# Patient Record
Sex: Male | Born: 1965 | Race: White | Hispanic: No | Marital: Married | Smoking: Never smoker
Health system: Southern US, Community
[De-identification: ages and names within clinical notes are randomized; demographics above are authoritative.]

## PROBLEM LIST (undated history)

## (undated) HISTORY — PX: APPENDECTOMY: SHX54

---

## 2013-10-13 ENCOUNTER — Ambulatory Visit: Payer: PRIVATE HEALTH INSURANCE | Admitting: Sports Medicine

## 2015-01-22 ENCOUNTER — Encounter (HOSPITAL_COMMUNITY): Payer: Self-pay | Admitting: *Deleted

## 2015-01-22 ENCOUNTER — Emergency Department (HOSPITAL_COMMUNITY)
Admission: EM | Admit: 2015-01-22 | Discharge: 2015-01-22 | Disposition: A | Discharge status: Home / Self Care | Payer: PRIVATE HEALTH INSURANCE | Attending: Emergency Medicine | Admitting: Emergency Medicine

## 2015-01-22 DIAGNOSIS — W51XXXA Accidental striking against or bumped into by another person, initial encounter: Secondary | ICD-10-CM | POA: Insufficient documentation

## 2015-01-22 DIAGNOSIS — Z23 Encounter for immunization: Secondary | ICD-10-CM | POA: Diagnosis not present

## 2015-01-22 DIAGNOSIS — Y9366 Activity, soccer: Secondary | ICD-10-CM | POA: Diagnosis not present

## 2015-01-22 DIAGNOSIS — S0990XA Unspecified injury of head, initial encounter: Secondary | ICD-10-CM

## 2015-01-22 DIAGNOSIS — Y92322 Soccer field as the place of occurrence of the external cause: Secondary | ICD-10-CM | POA: Diagnosis not present

## 2015-01-22 DIAGNOSIS — Y998 Other external cause status: Secondary | ICD-10-CM | POA: Diagnosis not present

## 2015-01-22 DIAGNOSIS — S0181XA Laceration without foreign body of other part of head, initial encounter: Secondary | ICD-10-CM | POA: Diagnosis not present

## 2015-01-22 MED ORDER — LIDOCAINE-EPINEPHRINE (PF) 2 %-1:200000 IJ SOLN
10.0000 mL | Freq: Once | INTRAMUSCULAR | Status: DC
  Filled 2015-01-22: qty 20

## 2015-01-22 NOTE — Discharge Instructions (Signed)
Triple antibiotic topically twice a day. Ice pack for swelling several times a day. Keep it clean. Follow up in 5-7 days for suture removal. Follow up with Dr. Kelly Splinter if any concern about scarring. Watch for signs of major head injury such as worsening headache, vomiting, dizziness, memory issues, confusion.   Facial Laceration  A facial laceration is a cut on the face. These injuries can be painful and cause bleeding. Lacerations usually heal quickly, but they need special care to reduce scarring. DIAGNOSIS  Your health care provider will take a medical history, ask for details about how the injury occurred, and examine the wound to determine how deep the cut is. TREATMENT  Some facial lacerations may not require closure. Others may not be able to be closed because of an increased risk of infection. The risk of infection and the chance for successful closure will depend on various factors, including the amount of time since the injury occurred. The wound may be cleaned to help prevent infection. If closure is appropriate, pain medicines may be given if needed. Your health care provider will use stitches (sutures), wound glue (adhesive), or skin adhesive strips to repair the laceration. These tools bring the skin edges together to allow for faster healing and a better cosmetic outcome. If needed, you may also be given a tetanus shot. HOME CARE INSTRUCTIONS  Only take over-the-counter or prescription medicines as directed by your health care provider.  Follow your health care provider's instructions for wound care. These instructions will vary depending on the technique used for closing the wound. For Sutures:  Keep the wound clean and dry.   If you were given a bandage (dressing), you should change it at least once a day. Also change the dressing if it becomes wet or dirty, or as directed by your health care provider.   Wash the wound with soap and water 2 times a day. Rinse the wound off with  water to remove all soap. Pat the wound dry with a clean towel.   After cleaning, apply a thin layer of the antibiotic ointment recommended by your health care provider. This will help prevent infection and keep the dressing from sticking.   You may shower as usual after the first 24 hours. Do not soak the wound in water until the sutures are removed.   Get your sutures removed as directed by your health care provider. With facial lacerations, sutures should usually be taken out after 4-5 days to avoid stitch marks.   Wait a few days after your sutures are removed before applying any makeup. For Skin Adhesive Strips:  Keep the wound clean and dry.   Do not get the skin adhesive strips wet. You may bathe carefully, using caution to keep the wound dry.   If the wound gets wet, pat it dry with a clean towel.   Skin adhesive strips will fall off on their own. You may trim the strips as the wound heals. Do not remove skin adhesive strips that are still stuck to the wound. They will fall off in time.  For Wound Adhesive:  You may briefly wet your wound in the shower or bath. Do not soak or scrub the wound. Do not swim. Avoid periods of heavy sweating until the skin adhesive has fallen off on its own. After showering or bathing, gently pat the wound dry with a clean towel.   Do not apply liquid medicine, cream medicine, ointment medicine, or makeup to your wound while the  skin adhesive is in place. This may loosen the film before your wound is healed.   If a dressing is placed over the wound, be careful not to apply tape directly over the skin adhesive. This may cause the adhesive to be pulled off before the wound is healed.   Avoid prolonged exposure to sunlight or tanning lamps while the skin adhesive is in place.  The skin adhesive will usually remain in place for 5-10 days, then naturally fall off the skin. Do not pick at the adhesive film.  After Healing: Once the wound has  healed, cover the wound with sunscreen during the day for 1 full year. This can help minimize scarring. Exposure to ultraviolet light in the first year will darken the scar. It can take 1-2 years for the scar to lose its redness and to heal completely.  SEEK IMMEDIATE MEDICAL CARE IF:  You have redness, pain, or swelling around the wound.   You see ayellowish-white fluid (pus) coming from the wound.   You have chills or a fever.  MAKE SURE YOU:  Understand these instructions.  Will watch your condition.  Will get help right away if you are not doing well or get worse. Document Released: 09/05/2004 Document Revised: 05/19/2013 Document Reviewed: 03/11/2013 [facility] Patient Information 2015 [place], [place]. This information is not intended to replace advice given to you by your health care provider. Make sure you discuss any questions you have with your health care provider.   Scar Minimization You will have a scar anytime you have surgery and a cut is made in the skin or you have something removed from your skin (mole, skin cancer, cyst). Although scars are unavoidable following surgery, there are ways to minimize their appearance. It is important to follow all the instructions you receive from your caregiver about wound care. How your wound heals will influence the appearance of your scar. If you do not follow the wound care instructions as directed, complications such as infection may occur. Wound instructions include keeping the wound clean, moist, and not letting the wound form a scab. Some people form scars that are raised and lumpy (hypertrophic) or larger than the initial wound (keloidal). HOME CARE INSTRUCTIONS   Follow wound care instructions as directed.  Keep the wound clean by washing it with soap and water.  Keep the wound moist with provided antibiotic cream or petroleum jelly until completely healed. Moisten twice a day for about 2 weeks.  Get stitches (sutures) taken  out at the scheduled time.  Avoid touching or manipulating your wound unless needed. Wash your hands thoroughly before and after touching your wound.  Follow all restrictions such as limits on exercise or work. This depends on where your scar is located.  Keep the scar protected from sunburn. Cover the scar with sunscreen/sunblock with SPF 30 or higher.  Gently massage the scar using a circular motion to help minimize the appearance of the scar. Do this only after the wound has closed and all the sutures have been removed.  For hypertrophic or keloidal scars, there are several ways to treat and minimize their appearance. Methods include compression therapy, intralesional corticosteroids, laser therapy, or surgery. These methods are performed by your caregiver. Remember that the scar may appear lighter or darker than your normal skin color. This difference in color should even out with time. SEEK MEDICAL CARE IF:   You have a fever.  You develop signs of infection such as pain, redness, pus, and warmth.  You have questions or concerns. Document Released: 01/16/2010 Document Revised: 10/21/2011 Document Reviewed: 01/16/2010 [facility] Patient Information 2015 [place], [place]. This information is not intended to replace advice given to you by your health care provider. Make sure you discuss any questions you have with your health care provider.   Head Injury You have received a head injury. It does not appear serious at this time. Headaches and vomiting are common following head injury. It should be easy to awaken from sleeping. Sometimes it is necessary for you to stay in the emergency department for a while for observation. Sometimes admission to the hospital may be needed. After injuries such as yours, most problems occur within the first 24 hours, but side effects may occur up to 7-10 days after the injury. It is important for you to carefully monitor your condition and contact your health  care provider or seek immediate medical care if there is a change in your condition. WHAT ARE THE TYPES OF HEAD INJURIES? Head injuries can be as minor as a bump. Some head injuries can be more severe. More severe head injuries include:  A jarring injury to the brain (concussion).  A bruise of the brain (contusion). This mean there is bleeding in the brain that can cause swelling.  A cracked skull (skull fracture).  Bleeding in the brain that collects, clots, and forms a bump (hematoma). WHAT CAUSES A HEAD INJURY? A serious head injury is most likely to happen to someone who is in a car wreck and is not wearing a seat belt. Other causes of major head injuries include bicycle or motorcycle accidents, sports injuries, and falls. HOW ARE HEAD INJURIES DIAGNOSED? A complete history of the event leading to the injury and your current symptoms will be helpful in diagnosing head injuries. Many times, pictures of the brain, such as CT or MRI are needed to see the extent of the injury. Often, an overnight hospital stay is necessary for observation.  WHEN SHOULD I SEEK IMMEDIATE MEDICAL CARE?  You should get help right away if:  You have confusion or drowsiness.  You feel sick to your stomach (nauseous) or have continued, forceful vomiting.  You have dizziness or unsteadiness that is getting worse.  You have severe, continued headaches not relieved by medicine. Only take over-the-counter or prescription medicines for pain, fever, or discomfort as directed by your health care provider.  You do not have normal function of the arms or legs or are unable to walk.  You notice changes in the black spots in the center of the colored part of your eye (pupil).  You have a clear or bloody fluid coming from your nose or ears.  You have a loss of vision. During the next 24 hours after the injury, you must stay with someone who can watch you for the warning signs. This person should contact local emergency  services (911 in the [place] if you have seizures, you become unconscious, or you are unable to wake up. HOW CAN I PREVENT A HEAD INJURY IN THE FUTURE? The most important factor for preventing major head injuries is avoiding motor vehicle accidents. To minimize the potential for damage to your head, it is crucial to wear seat belts while riding in motor vehicles. Wearing helmets while bike riding and playing collision sports (like football) is also helpful. Also, avoiding dangerous activities around the house will further help reduce your risk of head injury.  WHEN CAN I RETURN TO NORMAL ACTIVITIES AND ATHLETICS? You should  be reevaluated by your health care provider before returning to these activities. If you have any of the following symptoms, you should not return to activities or contact sports until 1 week after the symptoms have stopped:  Persistent headache.  Dizziness or vertigo.  Poor attention and concentration.  Confusion.  Memory problems.  Nausea or vomiting.  Fatigue or tire easily.  Irritability.  Intolerant of bright lights or loud noises.  Anxiety or depression.  Disturbed sleep. MAKE SURE YOU:   Understand these instructions.  Will watch your condition.  Will get help right away if you are not doing well or get worse. Document Released: 07/29/2005 Document Revised: 08/03/2013 Document Reviewed: 04/05/2013 [facility] Patient Information 2015 [place], [place]. This information is not intended to replace advice given to you by your health care provider. Make sure you discuss any questions you have with your health care provider.

## 2015-01-22 NOTE — ED Notes (Signed)
Tatyana PA at bedside suturing patient laceration.

## 2015-01-22 NOTE — ED Notes (Signed)
The pt was playing soccer and ran into another player lac rt [place].  No loc.  Bleeding controlled

## 2015-01-22 NOTE — ED Notes (Signed)
MD at bedside. 

## 2015-01-22 NOTE — ED Provider Notes (Addendum)
CSN: [id number]     Arrival date & time 01/22/15  1640 History   First MD Initiated Contact with Patient 01/22/15 1748     Chief Complaint  Patient presents with  . Laceration     (Consider location/radiation/quality/duration/timing/severity/associated sxs/prior Treatment) HPI Jerome Moses is a 49 y.o. male with no medical problems, not on any medications, presents to ED with complaint of a head injury. Patient was playing soccer, states he collided heads with another player. Patient sustained a laceration to the right temple. There was no loss of consciousness. He denies any dizziness, lightheadedness, amnesia, confusion, visual changes, nausea or vomiting. Pressure was applied to stop the bleeding. He is not anticoagulated. No medications taken prior to coming in. No prior major head injuries. States tetanus within 5-10 years.  History reviewed. No pertinent past medical history. History reviewed. No pertinent past surgical history. No family history on file. History  Substance Use Topics  . Smoking status: Never Smoker   . Smokeless tobacco: Not on file  . Alcohol Use: Yes    Review of Systems  Constitutional: Negative for fever and chills.  Eyes: Negative for photophobia.  Respiratory: Negative for cough, chest tightness and shortness of breath.   Cardiovascular: Negative for chest pain, palpitations and leg swelling.  Gastrointestinal: Negative for nausea, vomiting, abdominal pain, diarrhea and abdominal distention.  Musculoskeletal: Negative for myalgias, arthralgias, neck pain and neck stiffness.  Skin: Positive for wound. Negative for rash.  Allergic/Immunologic: Negative for immunocompromised state.  Neurological: Positive for headaches. Negative for dizziness, weakness, light-headedness and numbness.      Allergies  Review of patient's allergies indicates no known allergies.  Home Medications   Prior to Admission medications   Not on File   BP 145/80 mmHg   Pulse 70  Temp(Src) 98.4 F (36.9 C) (Oral)  Resp 18  Ht  (1.778 m)  Wt 158 lb 8 oz (71.895 kg)  BMI 22.74 kg/m2  SpO2 100% Physical Exam  Constitutional: He is oriented to person, place, and time. He appears well-developed and well-nourished. No distress.  HENT:  Head: Normocephalic and atraumatic.  Right Ear: External ear normal.  Left Ear: External ear normal.  Nose: Nose normal.  Mouth/Throat: Oropharynx is clear and moist.  Eyes: Conjunctivae and EOM are normal. Pupils are equal, round, and reactive to light.  Neck: Normal range of motion. Neck supple.  Cardiovascular: Normal rate, regular rhythm and normal heart sounds.   Pulmonary/Chest: Effort normal. No respiratory distress. He has no wheezes. He has no rales.  Abdominal: Soft. Bowel sounds are normal. He exhibits no distension. There is no tenderness. There is no rebound.  Musculoskeletal: He exhibits no edema.  Neurological: He is alert and oriented to person, place, and time. No cranial nerve deficit. Coordination normal.  5/5 and equal upper and lower extremity strength bilaterally. Equal grip strength bilaterally. Normal finger to nose and heel to shin. No pronator drift.   Skin: Skin is warm and dry.  6 centimeters stellate laceration to the lower right temple, slightly bleeding, gaping.  Nursing note and vitals reviewed.   ED Course  Procedures (including critical care time) Labs Review Labs Reviewed - No data to display  Imaging Review No results found.   EKG Interpretation None      LACERATION REPAIR Performed by: Lottie Mussel Authorized by: Jaynie Crumble A Consent: Verbal consent obtained. Risks and benefits: risks, benefits and alternatives were discussed Consent given by: patient Patient identity confirmed: provided demographic data  Prepped and Draped in normal sterile fashion Wound explored  Laceration Location: right forehead  Laceration Length:6cm  No Foreign Bodies  seen or palpated  Anesthesia: local infiltration  Local anesthetic: lidocaine 2% w epinephrine  Anesthetic total: 4 ml  Irrigation method: syringe Amount of cleaning: standard  Skin closure: prolene 6.0   Technique: simple interrupted  Patient tolerance: Patient tolerated the procedure well with no immediate complications.  MDM   Final diagnoses:  Laceration of forehead, initial encounter  Minor head injury, initial encounter    patient is here after minor head injury, presents with laceration to the right temple. Patient collided with another player while playing soccer. There was no loss of consciousness. The second player was uninjured and continued to play. Patient states only mild headache at the area of laceration. No dizziness. No nausea or vomiting. No memory loss.  no confusion. Pt otherwise healthy, not anticoagulated. No other distracting injuries. Patient does not appear to be intoxicated. Based on [place] CT rules though imaging is indicated. Vital signs are normal. Laceration repaired with sutures. His tetanus is up-to-date. Home with topical antibiotics ointment, follow-up in 5-7 days for suture removal.  Filed Vitals:   01/22/15 1701 01/22/15 1955  BP: 145/80 122/85  Pulse: 70 64  Temp: 98.4 F (36.9 C) 97.8 F (36.6 C)  TempSrc: Oral Oral  Resp: 18 18  Height: 5\' 10"  (1.778 m)   Weight: 158 lb 8 oz (71.895 kg)   SpO2: 100% 100%       Jaynie Crumble, PA-C 01/22/15 2342  Purvis Sheffield, MD 01/23/15 [address] PA-C 07/05/15 1832  Linwood Dibbles, MD 08/29/15 1000

## 2015-09-14 ENCOUNTER — Other Ambulatory Visit: Payer: Self-pay | Admitting: Internal Medicine

## 2015-09-14 DIAGNOSIS — Z1211 Encounter for screening for malignant neoplasm of colon: Secondary | ICD-10-CM

## 2015-10-13 ENCOUNTER — Other Ambulatory Visit: Payer: PRIVATE HEALTH INSURANCE

## 2015-12-01 ENCOUNTER — Ambulatory Visit
Admission: RE | Admit: 2015-12-01 | Discharge: 2015-12-01 | Disposition: A | Discharge status: Home / Self Care | Payer: PRIVATE HEALTH INSURANCE | Source: Ambulatory Visit | Attending: Internal Medicine | Admitting: Internal Medicine

## 2015-12-01 DIAGNOSIS — Z1211 Encounter for screening for malignant neoplasm of colon: Secondary | ICD-10-CM

## 2016-06-14 ENCOUNTER — Ambulatory Visit: Payer: PRIVATE HEALTH INSURANCE

## 2019-02-23 DIAGNOSIS — D2261 Melanocytic nevi of right upper limb, including shoulder: Secondary | ICD-10-CM | POA: Diagnosis not present

## 2019-02-23 DIAGNOSIS — D225 Melanocytic nevi of trunk: Secondary | ICD-10-CM | POA: Diagnosis not present

## 2019-02-23 DIAGNOSIS — D2262 Melanocytic nevi of left upper limb, including shoulder: Secondary | ICD-10-CM | POA: Diagnosis not present

## 2019-02-23 DIAGNOSIS — L821 Other seborrheic keratosis: Secondary | ICD-10-CM | POA: Diagnosis not present

## 2019-06-01 DIAGNOSIS — H2513 Age-related nuclear cataract, bilateral: Secondary | ICD-10-CM | POA: Diagnosis not present

## 2019-06-01 DIAGNOSIS — D23122 Other benign neoplasm of skin of left lower eyelid, including canthus: Secondary | ICD-10-CM | POA: Diagnosis not present

## 2019-06-01 DIAGNOSIS — H35411 Lattice degeneration of retina, right eye: Secondary | ICD-10-CM | POA: Diagnosis not present

## 2019-08-24 DIAGNOSIS — Z13220 Encounter for screening for lipoid disorders: Secondary | ICD-10-CM | POA: Diagnosis not present

## 2019-08-24 DIAGNOSIS — Z0000 Encounter for general adult medical examination without abnormal findings: Secondary | ICD-10-CM | POA: Diagnosis not present

## 2019-08-24 DIAGNOSIS — Z125 Encounter for screening for malignant neoplasm of prostate: Secondary | ICD-10-CM | POA: Diagnosis not present

## 2019-08-24 DIAGNOSIS — N3943 Post-void dribbling: Secondary | ICD-10-CM | POA: Diagnosis not present

## 2019-08-24 DIAGNOSIS — M25562 Pain in left knee: Secondary | ICD-10-CM | POA: Diagnosis not present

## 2019-08-24 DIAGNOSIS — D696 Thrombocytopenia, unspecified: Secondary | ICD-10-CM | POA: Diagnosis not present

## 2019-08-25 DIAGNOSIS — M7632 Iliotibial band syndrome, left leg: Secondary | ICD-10-CM | POA: Diagnosis not present

## 2019-08-31 ENCOUNTER — Other Ambulatory Visit: Payer: PRIVATE HEALTH INSURANCE

## 2019-09-03 DIAGNOSIS — M7632 Iliotibial band syndrome, left leg: Secondary | ICD-10-CM | POA: Diagnosis not present

## 2019-09-03 DIAGNOSIS — M6281 Muscle weakness (generalized): Secondary | ICD-10-CM | POA: Diagnosis not present

## 2019-09-29 DIAGNOSIS — M25511 Pain in right shoulder: Secondary | ICD-10-CM | POA: Diagnosis not present

## 2019-09-29 DIAGNOSIS — M7632 Iliotibial band syndrome, left leg: Secondary | ICD-10-CM | POA: Diagnosis not present

## 2020-04-23 ENCOUNTER — Ambulatory Visit: Payer: PRIVATE HEALTH INSURANCE | Attending: Internal Medicine

## 2020-04-23 DIAGNOSIS — Z23 Encounter for immunization: Secondary | ICD-10-CM

## 2020-04-23 NOTE — Progress Notes (Signed)
   Covid-19 Vaccination Clinic  Name:  Shareef Eddinger    MRN: [id number] DOB: 06/26/1966  04/23/2020  Mr. Leeper was observed post Covid-19 immunization for 15 minutes without incident. He was provided with Vaccine Information Sheet and instruction to access the V-Safe system.   Mr. Tullis was instructed to call 911 with any severe reactions post vaccine: Marland Kitchen Difficulty breathing  . Swelling of face and throat  . A fast heartbeat  . A bad rash all over body  . Dizziness and weakness

## 2020-05-02 DIAGNOSIS — D2272 Melanocytic nevi of left lower limb, including hip: Secondary | ICD-10-CM | POA: Diagnosis not present

## 2020-05-02 DIAGNOSIS — D1801 Hemangioma of skin and subcutaneous tissue: Secondary | ICD-10-CM | POA: Diagnosis not present

## 2020-05-02 DIAGNOSIS — D225 Melanocytic nevi of trunk: Secondary | ICD-10-CM | POA: Diagnosis not present

## 2020-05-02 DIAGNOSIS — L812 Freckles: Secondary | ICD-10-CM | POA: Diagnosis not present

## 2020-05-15 DIAGNOSIS — M7061 Trochanteric bursitis, right hip: Secondary | ICD-10-CM | POA: Diagnosis not present

## 2020-07-04 DIAGNOSIS — H2513 Age-related nuclear cataract, bilateral: Secondary | ICD-10-CM | POA: Diagnosis not present

## 2020-07-04 DIAGNOSIS — D23122 Other benign neoplasm of skin of left lower eyelid, including canthus: Secondary | ICD-10-CM | POA: Diagnosis not present

## 2020-07-04 DIAGNOSIS — H35411 Lattice degeneration of retina, right eye: Secondary | ICD-10-CM | POA: Diagnosis not present

## 2020-08-29 DIAGNOSIS — D696 Thrombocytopenia, unspecified: Secondary | ICD-10-CM | POA: Diagnosis not present

## 2020-08-29 DIAGNOSIS — Z13220 Encounter for screening for lipoid disorders: Secondary | ICD-10-CM | POA: Diagnosis not present

## 2020-08-29 DIAGNOSIS — Z0000 Encounter for general adult medical examination without abnormal findings: Secondary | ICD-10-CM | POA: Diagnosis not present

## 2020-08-29 DIAGNOSIS — Z125 Encounter for screening for malignant neoplasm of prostate: Secondary | ICD-10-CM | POA: Diagnosis not present

## 2020-09-23 DIAGNOSIS — Z20822 Contact with and (suspected) exposure to covid-19: Secondary | ICD-10-CM | POA: Diagnosis not present

## 2020-10-17 DIAGNOSIS — H02054 Trichiasis without entropian left upper eyelid: Secondary | ICD-10-CM | POA: Diagnosis not present

## 2020-10-17 DIAGNOSIS — H10413 Chronic giant papillary conjunctivitis, bilateral: Secondary | ICD-10-CM | POA: Diagnosis not present

## 2020-11-30 DIAGNOSIS — Z23 Encounter for immunization: Secondary | ICD-10-CM | POA: Diagnosis not present

## 2021-05-08 DIAGNOSIS — D2271 Melanocytic nevi of right lower limb, including hip: Secondary | ICD-10-CM | POA: Diagnosis not present

## 2021-05-08 DIAGNOSIS — D225 Melanocytic nevi of trunk: Secondary | ICD-10-CM | POA: Diagnosis not present

## 2021-05-08 DIAGNOSIS — D2261 Melanocytic nevi of right upper limb, including shoulder: Secondary | ICD-10-CM | POA: Diagnosis not present

## 2021-05-08 DIAGNOSIS — D2272 Melanocytic nevi of left lower limb, including hip: Secondary | ICD-10-CM | POA: Diagnosis not present

## 2021-08-09 DIAGNOSIS — R079 Chest pain, unspecified: Secondary | ICD-10-CM | POA: Diagnosis not present

## 2021-08-10 ENCOUNTER — Other Ambulatory Visit (HOSPITAL_COMMUNITY): Payer: Self-pay | Admitting: Internal Medicine

## 2021-08-10 ENCOUNTER — Ambulatory Visit (HOSPITAL_COMMUNITY)
Admission: RE | Admit: 2021-08-10 | Discharge: 2021-08-10 | Disposition: A | Discharge status: Home / Self Care | Payer: PRIVATE HEALTH INSURANCE | Source: Ambulatory Visit | Attending: Internal Medicine | Admitting: Internal Medicine

## 2021-08-10 ENCOUNTER — Telehealth (HOSPITAL_COMMUNITY): Payer: Self-pay | Admitting: Cardiology

## 2021-08-10 DIAGNOSIS — R0789 Other chest pain: Secondary | ICD-10-CM

## 2021-08-10 DIAGNOSIS — R002 Palpitations: Secondary | ICD-10-CM

## 2021-08-10 NOTE — Telephone Encounter (Signed)
Per Dr Gala Romney  Patient will need zio and echo  RE palp/chest pressure Orders entered and pt aware via detailed message Zio to be mailed to patient Echo 1/4 @ 11

## 2021-08-15 ENCOUNTER — Other Ambulatory Visit: Payer: Self-pay

## 2021-08-15 ENCOUNTER — Ambulatory Visit (HOSPITAL_COMMUNITY)
Admission: RE | Admit: 2021-08-15 | Discharge: 2021-08-15 | Disposition: A | Discharge status: Home / Self Care | Payer: BC Managed Care – PPO | Source: Ambulatory Visit | Attending: Internal Medicine | Admitting: Internal Medicine

## 2021-08-15 ENCOUNTER — Other Ambulatory Visit: Payer: Self-pay | Admitting: Internal Medicine

## 2021-08-15 DIAGNOSIS — I340 Nonrheumatic mitral (valve) insufficiency: Secondary | ICD-10-CM | POA: Diagnosis not present

## 2021-08-15 DIAGNOSIS — R002 Palpitations: Secondary | ICD-10-CM | POA: Diagnosis not present

## 2021-08-15 DIAGNOSIS — R079 Chest pain, unspecified: Secondary | ICD-10-CM

## 2021-08-15 DIAGNOSIS — R0789 Other chest pain: Secondary | ICD-10-CM | POA: Diagnosis not present

## 2021-08-15 LAB — ECHOCARDIOGRAM COMPLETE
AR max vel: 2.75 cm2
AV Peak grad: 4.6 mmHg
Ao pk vel: 1.07 m/s
Area-P 1/2: 3.08 cm2
Calc EF: 49 %
P 1/2 time: 673 msec
S' Lateral: 3.1 cm
Single Plane A2C EF: 49.7 %
Single Plane A4C EF: 52.3 %

## 2021-08-15 NOTE — Progress Notes (Signed)
°  Echocardiogram 2D Echocardiogram has been performed.  Jefferey Pica 08/15/2021, 11:32 AM

## 2021-08-22 ENCOUNTER — Ambulatory Visit
Admission: RE | Admit: 2021-08-22 | Discharge: 2021-08-22 | Disposition: A | Discharge status: Home / Self Care | Payer: PRIVATE HEALTH INSURANCE | Source: Ambulatory Visit | Attending: Internal Medicine | Admitting: Internal Medicine

## 2021-08-22 DIAGNOSIS — I4891 Unspecified atrial fibrillation: Secondary | ICD-10-CM | POA: Diagnosis not present

## 2021-08-22 DIAGNOSIS — R079 Chest pain, unspecified: Secondary | ICD-10-CM

## 2021-09-04 ENCOUNTER — Other Ambulatory Visit: Payer: PRIVATE HEALTH INSURANCE

## 2021-09-13 DIAGNOSIS — R079 Chest pain, unspecified: Secondary | ICD-10-CM | POA: Diagnosis not present

## 2021-09-13 DIAGNOSIS — Z1211 Encounter for screening for malignant neoplasm of colon: Secondary | ICD-10-CM | POA: Diagnosis not present

## 2021-09-13 DIAGNOSIS — Z125 Encounter for screening for malignant neoplasm of prostate: Secondary | ICD-10-CM | POA: Diagnosis not present

## 2021-09-13 DIAGNOSIS — D696 Thrombocytopenia, unspecified: Secondary | ICD-10-CM | POA: Diagnosis not present

## 2021-09-13 DIAGNOSIS — Z0000 Encounter for general adult medical examination without abnormal findings: Secondary | ICD-10-CM | POA: Diagnosis not present

## 2021-09-20 DIAGNOSIS — Z13220 Encounter for screening for lipoid disorders: Secondary | ICD-10-CM | POA: Diagnosis not present

## 2021-09-20 DIAGNOSIS — R079 Chest pain, unspecified: Secondary | ICD-10-CM | POA: Diagnosis not present

## 2021-09-20 DIAGNOSIS — Z125 Encounter for screening for malignant neoplasm of prostate: Secondary | ICD-10-CM | POA: Diagnosis not present

## 2021-09-20 DIAGNOSIS — D696 Thrombocytopenia, unspecified: Secondary | ICD-10-CM | POA: Diagnosis not present

## 2021-12-14 DIAGNOSIS — D23122 Other benign neoplasm of skin of left lower eyelid, including canthus: Secondary | ICD-10-CM | POA: Diagnosis not present

## 2021-12-14 DIAGNOSIS — H2513 Age-related nuclear cataract, bilateral: Secondary | ICD-10-CM | POA: Diagnosis not present

## 2021-12-18 ENCOUNTER — Ambulatory Visit (INDEPENDENT_AMBULATORY_CARE_PROVIDER_SITE_OTHER): Payer: BC Managed Care – PPO | Admitting: Sports Medicine

## 2021-12-18 ENCOUNTER — Ambulatory Visit: Payer: Self-pay

## 2021-12-18 VITALS — BP 120/76 | Ht 71.0 in | Wt 160.0 lb

## 2021-12-18 DIAGNOSIS — M7661 Achilles tendinitis, right leg: Secondary | ICD-10-CM

## 2021-12-18 NOTE — Progress Notes (Signed)
? ?  Subjective:  ? ? Patient ID: Jerome Moses, male    DOB: 08-Apr-1966, 56 y.o.   MRN: [id number] ? ?HPI chief complaint: Right Achilles pain ? 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  He denies any trauma.  His pain is most noticeable at the beginning of a run and at the end of a run.  He has minimal pain with running itself.  Typically runs between 25 and 30 miles a week.  He has noticed a bump in his area of pain but denies swelling elsewhere.  He has tried treating it with a Theragun at home.  Does have a history of previous Achilles tendinitis which he self treated with complete resolution.  He denies pain in the left Achilles. ? ?Past medical history reviewed ?Medications reviewed ?Allergies reviewed ? ? ? ?Review of Systems ?As above ?   ?Objective:  ? Physical Exam ? ?Well-developed, fit appearing.  No acute distress ? ?Right Achilles: There is a palpable Haglund's deformity at the distal Achilles.  No soft tissue swelling.  Good pulses.  Mild tenderness to palpation at the Haglund's deformity.  Good strength.  Walking without a limp. ? ?Left Achilles: There is also a palpable Haglund's deformity here but it is not tender to palpation.  Good pulses. ? ?Limited MSK ultrasound of the right Achilles: ? ?Images obtained of the distal Achilles tendon in both long and short axis shows a large area of calcification within the insertion of the tendon just proximal to the calcaneus.  Findings consistent with calcific tendinopathy of the distal Achilles tendon. ? ? ?   ?Assessment & Plan:  ? ?Calcific insertional Achilles tendinopathy, right foot ? ?5/16 inch heel lifts to be worn in his running shoes.  Daily Alfredson heel drop exercises (he will stop at the level of the step and not drop below this).  Follow-up with me again in 4 weeks for  reevaluation.  Consider soundwave treatment if symptoms persist.  In the meantime, he may continue to run using pain as his guide. ? ?This note was dictated using Dragon naturally speaking software and may contain errors in syntax, spelling, or content which have not been identified prior to signing this note.  ?

## 2021-12-19 ENCOUNTER — Ambulatory Visit: Payer: BC Managed Care – PPO | Admitting: Family Medicine

## 2022-01-15 ENCOUNTER — Ambulatory Visit (INDEPENDENT_AMBULATORY_CARE_PROVIDER_SITE_OTHER): Payer: BC Managed Care – PPO | Admitting: Sports Medicine

## 2022-01-15 VITALS — BP 112/78 | Ht 71.0 in | Wt 160.0 lb

## 2022-01-15 DIAGNOSIS — M7661 Achilles tendinitis, right leg: Secondary | ICD-10-CM | POA: Diagnosis not present

## 2022-01-16 NOTE — Progress Notes (Signed)
   Subjective:    Patient ID: Jerome Moses, male    DOB: September 12, 1965, 56 y.o.   MRN: [id number]  HPI  [name] presents today for follow-up on right Achilles tendinopathy.  He has about 50 to 70% improved.  He is doing well with his home exercises and has avoided shoes with a 0 drop heel.  He has been able to continue running and is pleased with his progress to date.  Review of Systems As above    Objective:   Physical Exam  Well-developed, well-nourished.  No acute distress  Right Achilles: There is still palpable Haglund's deformity at the distal Achilles but no tenderness to palpation.  No soft tissue swelling.  Good strength.  Walking without a limp.      Assessment & Plan:   Improving right Achilles tendinopathy  Given his overall improvement we will hold on further work-up or treatment at this time.  He will continue with his Alfredson home exercise program and we will defer soundwave treatment at this time.  No restrictions at this time on activity including running.  Follow-up for ongoing or recalcitrant issues.  This note was dictated using Dragon naturally speaking software and may contain errors in syntax, spelling, or content which have not been identified prior to signing this note.

## 2022-04-16 ENCOUNTER — Ambulatory Visit
Admission: RE | Admit: 2022-04-16 | Discharge: 2022-04-16 | Disposition: A | Discharge status: Home / Self Care | Payer: BC Managed Care – PPO | Source: Ambulatory Visit | Attending: Sports Medicine | Admitting: Sports Medicine

## 2022-04-16 ENCOUNTER — Ambulatory Visit (INDEPENDENT_AMBULATORY_CARE_PROVIDER_SITE_OTHER): Payer: BC Managed Care – PPO | Admitting: Sports Medicine

## 2022-04-16 VITALS — BP 132/80 | Ht 71.0 in | Wt 162.0 lb

## 2022-04-16 DIAGNOSIS — M25571 Pain in right ankle and joints of right foot: Secondary | ICD-10-CM

## 2022-04-16 DIAGNOSIS — S93491A Sprain of other ligament of right ankle, initial encounter: Secondary | ICD-10-CM | POA: Diagnosis not present

## 2022-04-16 DIAGNOSIS — S93401A Sprain of unspecified ligament of right ankle, initial encounter: Secondary | ICD-10-CM | POA: Diagnosis not present

## 2022-04-16 NOTE — Progress Notes (Signed)
   Subjective:    Patient ID: Jerome Moses, male    DOB: 01/22/1966, 56 y.o.   MRN: [id number]  HPI chief complaint: Right ankle pain  Jerome Moses presents today with approximate 1 month of lateral right ankle pain.  He suffered an inversion injury to this ankle when running.  She had pain and swelling at the time but does not recall any pop or crack in the ankle.  Since then he has had persistent stiffness in the ankle.  He also had persistent pain up until about last week but over the past week or so his pain has improved.  In fact, he just returned from a trip to [place] where he walked approximately 10 miles.  He did well with that.  He denies any recent ankle injuries.  He does have a history of Achilles tendinopathy but that is unrelated to his current symptoms.  He has a remote injury of ankle sprains when younger but nothing recently.  No prior ankle surgeries.    Review of Systems As above    Objective:   Physical Exam  Well-developed, well-nourished.  No acute distress  Right ankle: Full range of motion.  No effusion.  Mild soft tissue swelling around the lateral malleolus.  Tender to palpation slightly at the ATF.  No tenderness to palpation at the posterior lateral malleolus.  No tenderness over the medial malleolus nor over the navicular.  No tenderness at the base of the fifth metatarsal.  Negative anterior drawer, negative talar tilt.  Good pulses.  Ambulating without a noticeable limp.      Assessment & Plan:   Improving right ankle pain likely secondary to ankle sprain  We will check x-rays of the right ankle including AP, lateral, and mortise views to rule out fracture.  We will go ahead and give him home exercises including ankle strengthening and proprioception exercises.  He has a med spec brace which he has purchased but he may be more comfortable in a body helix compression sleeve.  If x-rays show no obvious fracture that he is okay to resume running but will be gradual  about increasing his mileage since he has not really run since the injury 4 weeks ago.  This note was dictated using Dragon naturally speaking software and may contain errors in syntax, spelling, or content which have not been identified prior to signing this note.   Addendum: X-rays reviewed.  Nothing acute is seen.  There is some evidence of his old ankle injuries as a teenager as well as some calcific Achilles tendinopathy.

## 2022-04-17 ENCOUNTER — Encounter: Payer: Self-pay | Admitting: *Deleted

## 2022-05-27 DIAGNOSIS — D2261 Melanocytic nevi of right upper limb, including shoulder: Secondary | ICD-10-CM | POA: Diagnosis not present

## 2022-05-27 DIAGNOSIS — L570 Actinic keratosis: Secondary | ICD-10-CM | POA: Diagnosis not present

## 2022-05-27 DIAGNOSIS — D225 Melanocytic nevi of trunk: Secondary | ICD-10-CM | POA: Diagnosis not present

## 2022-05-27 DIAGNOSIS — L812 Freckles: Secondary | ICD-10-CM | POA: Diagnosis not present

## 2022-05-27 DIAGNOSIS — D2262 Melanocytic nevi of left upper limb, including shoulder: Secondary | ICD-10-CM | POA: Diagnosis not present

## 2022-08-15 ENCOUNTER — Ambulatory Visit (INDEPENDENT_AMBULATORY_CARE_PROVIDER_SITE_OTHER): Payer: BC Managed Care – PPO | Admitting: Sports Medicine

## 2022-08-15 VITALS — BP 122/82 | Ht 71.0 in | Wt 160.0 lb

## 2022-08-15 DIAGNOSIS — M2242 Chondromalacia patellae, left knee: Secondary | ICD-10-CM | POA: Diagnosis not present

## 2022-08-15 NOTE — Assessment & Plan Note (Addendum)
Patient presents with anterior left knee pain. No evidence of tendinopathy or degenerative changes on limited US of knee. Recommend using patellar strap when running as well as VMO strengthening exercises and eccentric patellar tendon exercises. If no improvement in 3-4 weeks, will refer to PT.

## 2022-08-15 NOTE — Progress Notes (Addendum)
   Established Patient Office Visit  Subjective   Patient ID: Jerome Moses, male    DOB: 1966-04-21  Age: 57 y.o. MRN: [id number]  CC: Left knee pain   HPI: Jerome Moses is a 57 year old male presenting with 3 weeks of left anterior knee pain. He is a very active runner and typically runs 10-12 miles on Saturdays as his long run. He denies recent trauma. He describes the pain as an intermittent soreness behind his kneecap that becomes sharp when going up stairs. Bending and extending his knee also result in soreness. He notices the soreness at the beginning of his runs but then it goes away, and he has not had to limit his running due to pain. He denies swelling or redness. He has not tried ice, compression, or any medications. He has never had any knee issues in the past.     Objective:     BP 122/82   Ht 5\' 11"  (1.803 m)   Wt 160 lb (72.6 kg)   BMI 22.32 kg/m    Physical Exam Left Knee Inspection: No effusion or redness. Normal gait.  Palpation: Mild tenderness to palpation of anterior patella and patellar tendon.  ROM: Full extension and flexion.  Strength: 5/5. Special Tests: Negative Thessaly. Negative anterior drawer.   Limited US Anterior L Knee Patellar tendon without abnormalities in long and short axis views. Patellar cartilage with no evidence of degenerative changes.    Assessment & Plan:   Problem List Items Addressed This Visit       Musculoskeletal and Integument   Chondromalacia patellae, left knee - Primary    Patient presents with anterior left knee pain. No evidence of tendinopathy or degenerative changes on limited US of knee. Recommend using patellar strap when running as well as VMO strengthening exercises and eccentric patellar tendon exercises. If no improvement in 3-4 weeks, will refer to PT.        Follow-up as needed.    Stefani Dama, Medical Student  Patient seen and evaluated with the medical student.  I agree with the above plan of  care.  Patient's exam today is fairly unremarkable.  His location of pain suggests chondromalacia in the inferior portion of the patella.  We will give him some home exercises and I think he is okay to continue running using pain as his guide.  He will also try patellar strap when running.  We did discuss the possibility of referral for formal physical therapy if symptoms do not improve over the next 3 to 4 weeks.  Will follow-up for ongoing or recalcitrant issues.  This note was dictated using Dragon naturally speaking software and may contain errors in syntax, spelling, or content which have not been identified prior to signing this note.

## 2022-08-27 ENCOUNTER — Ambulatory Visit
Admission: RE | Admit: 2022-08-27 | Discharge: 2022-08-27 | Disposition: A | Discharge status: Home / Self Care | Payer: BC Managed Care – PPO | Source: Ambulatory Visit | Attending: Sports Medicine | Admitting: Sports Medicine

## 2022-08-27 ENCOUNTER — Ambulatory Visit (INDEPENDENT_AMBULATORY_CARE_PROVIDER_SITE_OTHER): Payer: BC Managed Care – PPO | Admitting: Sports Medicine

## 2022-08-27 VITALS — BP 110/70 | Ht 71.0 in | Wt 160.0 lb

## 2022-08-27 DIAGNOSIS — M2242 Chondromalacia patellae, left knee: Secondary | ICD-10-CM

## 2022-08-27 DIAGNOSIS — M25562 Pain in left knee: Secondary | ICD-10-CM | POA: Diagnosis not present

## 2022-08-27 NOTE — Progress Notes (Signed)
   Subjective:   HPI: Patient is a 57 y.o. male here for left knee pain follow-up.  Patient has been doing the exercises for VMO strengthening and eccentric patellar tendon exercises without improvement. He is having pain when he is running now and has had to take stops during his run because of the pain. He reports that the pain is the worst when he is doing the exercises given where his heel is lifted and he squats at a 45-degree angle.   PMH, medications, surgical history, social history, family history and allergies reviewed.     Objective:  Physical Exam: BP 110/70   Ht 5\' 11"  (1.803 m)   Wt 160 lb (72.6 kg)   BMI 22.32 kg/m  Gen: awake, alert, NAD, comfortable in exam room Pulm: breathing unlabored  Left Knee: - Inspection: no gross deformity. No swelling/effusion, erythema or bruising. Skin intact - Palpation: TTP under the patella and when flexed to 45 degrees - ROM: full active ROM with flexion and extension in knee and hip, though there is pain with 45 degrees of knee flexion - Strength: 5/5 strength - Neuro/vasc: NV intact - Special Tests: - LIGAMENTS: negative anterior and posterior drawer, negative Lachman's, no MCL or LCL laxity  -- MENISCUS: negative McMurray's, negative Thessaly  -- PF JOINT: crepitus with palpation while passively doing ROM with patient  Hips: normal ROM, negative FABER and FADIR bilaterally   Assessment & Plan:  1.  Chondromalacia patella, left knee Given the worsening of his symptoms despite the exercises previously provided and using the patellar strap, we will go ahead and obtain x-rays to evaluate the knee. Feel that initiating formal physical therapy is now warranted and would want the patient to follow-up 3 weeks after PT is initiated to see if there has been improvement.  - DG left knee 3 view - PT referral - 3 week follow-up after PT starts   Lakesia Dahle, DO   Patient seen and evaluated with the resident.  I agree with the above  plan of care.  X-rays are unremarkable.  We will refer to physical therapy and the patient will follow-up with me approximately 3 weeks after starting PT.  If he does not notice initial improvement with PT and consider further diagnostic imaging.  I think he is okay to continue with activity, including running, using pain as his guide.  This note was dictated using Dragon naturally speaking software and may contain errors in syntax, spelling, or content which have not been identified prior to signing this note.

## 2022-08-28 ENCOUNTER — Encounter: Payer: Self-pay | Admitting: Sports Medicine

## 2022-08-29 DIAGNOSIS — M25569 Pain in unspecified knee: Secondary | ICD-10-CM | POA: Diagnosis not present

## 2022-09-03 DIAGNOSIS — M25569 Pain in unspecified knee: Secondary | ICD-10-CM | POA: Diagnosis not present

## 2022-09-16 DIAGNOSIS — M25569 Pain in unspecified knee: Secondary | ICD-10-CM | POA: Diagnosis not present

## 2022-09-17 ENCOUNTER — Ambulatory Visit (INDEPENDENT_AMBULATORY_CARE_PROVIDER_SITE_OTHER): Payer: BC Managed Care – PPO | Admitting: Sports Medicine

## 2022-09-17 VITALS — BP 118/82 | Ht 71.0 in | Wt 163.0 lb

## 2022-09-17 DIAGNOSIS — M2242 Chondromalacia patellae, left knee: Secondary | ICD-10-CM

## 2022-09-17 NOTE — Progress Notes (Signed)
   Subjective:    Patient ID: Jerome Moses, male    DOB: 15-Dec-1965, 57 y.o.   MRN: [id number]  HPI  Jerome Moses presents today for follow-up on left knee pain secondary to chondromalacia patella.  He is doing very well.  He is making excellent progress in physical therapy.  He has 1 more appointment scheduled.  He has been able to return to running.  He is happy with his progress to date.   Review of Systems As above    Objective:   Physical Exam  Well-developed, well-nourished.  No acute distress  Left knee: Full range of motion.  No effusion.  Still some slight pain with patellar compression but not bad.  Neurovascular intact distally.      Assessment & Plan:   Improving left knee pain secondary to chondromalacia patella  Jerome Moses will continue working with physical therapy until he is ready to wean to a home exercise program.  He understands the importance of continuing with these exercises twice daily going forward.  No restrictions on activity.  Follow-up for ongoing or recalcitrant issues.  This note was dictated using Dragon naturally speaking software and may contain errors in syntax, spelling, or content which have not been identified prior to signing this note.

## 2022-09-30 DIAGNOSIS — M25569 Pain in unspecified knee: Secondary | ICD-10-CM | POA: Diagnosis not present

## 2022-10-05 DIAGNOSIS — M25569 Pain in unspecified knee: Secondary | ICD-10-CM | POA: Diagnosis not present

## 2022-10-15 DIAGNOSIS — M25569 Pain in unspecified knee: Secondary | ICD-10-CM | POA: Diagnosis not present

## 2022-12-10 IMAGING — CT CT CARDIAC CORONARY ARTERY CALCIUM SCORE
1 series · 1 of 2 positions shown · non-contrast
Comparison: None.

CLINICAL DATA: 55-year-old Caucasian male.  Insert fibrillation.

EXAM:
CT CARDIAC CORONARY ARTERY CALCIUM SCORE
TECHNIQUE: Non-contrast imaging through the heart was performed using
prospective ECG gating. Image post processing was performed on an
independent workstation, allowing for quantitative analysis of the
heart and coronary arteries. Note that this exam targets the heart
and the chest was not imaged in its entirety.

[Series 1247: ca score · 0.86mm/px · 1 of 2 slices shown]
[im 2/2]
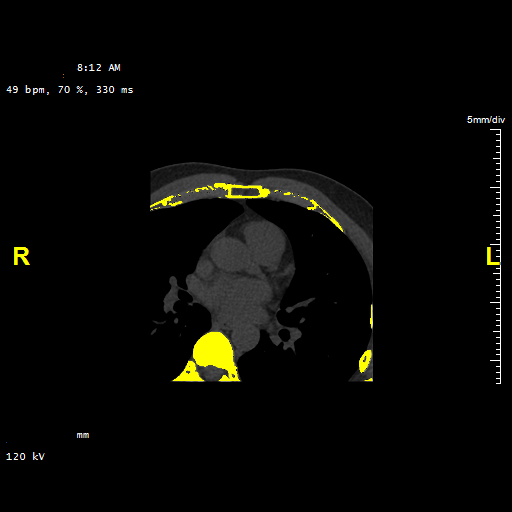

[1 of 2 positions shown; findings below may reference images not displayed]

FINDINGS: Technical quality: Good

CORONARY CALCIUM SCORES:

Left Main: No coronary artery calcification

LAD: No coronary calcification

LCx: No coronary calcification

RCA: No coronary calcification

CORONARY CALCIUM

Total Agatston Score: 0

[HOSPITAL] percentile: 0

Ascending aorta (normal <  40 mm): 32 mm

EXTRACARDIAC FINDINGS:

Limited view of the lung parenchyma demonstrates bilateral mild
pleural nodularity in the medial LEFT and RIGHT lower lobe (images 3
through 27 series 6, greater on the RIGHT). Normal parenchyma.
Normal airways. Airways are normal.

Limited view of the mediastinum demonstrates no adenopathy.
Esophagus normal.

Limited view of the upper abdomen is unremarkable.

Limited view of the skeleton and chest wall is unremarkable.
IMPRESSION: 1. No coronary artery calcification.

2. Total Agatston Score: 0

3. MESA age and sex matched database percentile: 0

4. Bilateral mild pleural nodularity in the medial lower lobes is a
benign pattern.

## 2023-01-07 ENCOUNTER — Encounter: Payer: Self-pay | Admitting: Internal Medicine

## 2023-01-07 ENCOUNTER — Other Ambulatory Visit: Payer: Self-pay | Admitting: Internal Medicine

## 2023-01-07 DIAGNOSIS — D696 Thrombocytopenia, unspecified: Secondary | ICD-10-CM | POA: Diagnosis not present

## 2023-01-07 DIAGNOSIS — Z1211 Encounter for screening for malignant neoplasm of colon: Secondary | ICD-10-CM

## 2023-01-07 DIAGNOSIS — E7800 Pure hypercholesterolemia, unspecified: Secondary | ICD-10-CM | POA: Diagnosis not present

## 2023-01-07 DIAGNOSIS — Z0000 Encounter for general adult medical examination without abnormal findings: Secondary | ICD-10-CM | POA: Diagnosis not present

## 2023-01-07 DIAGNOSIS — Z125 Encounter for screening for malignant neoplasm of prostate: Secondary | ICD-10-CM | POA: Diagnosis not present

## 2023-01-08 DIAGNOSIS — D23122 Other benign neoplasm of skin of left lower eyelid, including canthus: Secondary | ICD-10-CM | POA: Diagnosis not present

## 2023-01-08 DIAGNOSIS — H1789 Other corneal scars and opacities: Secondary | ICD-10-CM | POA: Diagnosis not present

## 2023-01-08 DIAGNOSIS — H25812 Combined forms of age-related cataract, left eye: Secondary | ICD-10-CM | POA: Diagnosis not present

## 2023-01-08 DIAGNOSIS — H35411 Lattice degeneration of retina, right eye: Secondary | ICD-10-CM | POA: Diagnosis not present

## 2023-02-11 DIAGNOSIS — N5201 Erectile dysfunction due to arterial insufficiency: Secondary | ICD-10-CM | POA: Diagnosis not present

## 2023-02-11 DIAGNOSIS — R972 Elevated prostate specific antigen [PSA]: Secondary | ICD-10-CM | POA: Diagnosis not present

## 2023-02-26 ENCOUNTER — Ambulatory Visit
Admission: RE | Admit: 2023-02-26 | Discharge: 2023-02-26 | Disposition: A | Discharge status: Home / Self Care | Payer: BC Managed Care – PPO | Source: Ambulatory Visit | Attending: Internal Medicine | Admitting: Internal Medicine

## 2023-02-26 DIAGNOSIS — Z1211 Encounter for screening for malignant neoplasm of colon: Secondary | ICD-10-CM

## 2023-04-07 DIAGNOSIS — R972 Elevated prostate specific antigen [PSA]: Secondary | ICD-10-CM | POA: Diagnosis not present

## 2023-04-07 DIAGNOSIS — N4289 Other specified disorders of prostate: Secondary | ICD-10-CM | POA: Diagnosis not present

## 2023-04-18 ENCOUNTER — Other Ambulatory Visit: Payer: Self-pay | Admitting: Medical Genetics

## 2023-04-18 DIAGNOSIS — Z006 Encounter for examination for normal comparison and control in clinical research program: Secondary | ICD-10-CM

## 2023-04-22 DIAGNOSIS — N5201 Erectile dysfunction due to arterial insufficiency: Secondary | ICD-10-CM | POA: Diagnosis not present

## 2023-04-22 DIAGNOSIS — R972 Elevated prostate specific antigen [PSA]: Secondary | ICD-10-CM | POA: Diagnosis not present

## 2023-05-28 ENCOUNTER — Other Ambulatory Visit (HOSPITAL_COMMUNITY)
Admission: RE | Admit: 2023-05-28 | Discharge: 2023-05-28 | Disposition: A | Discharge status: Home / Self Care | Payer: BC Managed Care – PPO | Source: Ambulatory Visit | Attending: Oncology | Admitting: Oncology

## 2023-05-28 DIAGNOSIS — Z006 Encounter for examination for normal comparison and control in clinical research program: Secondary | ICD-10-CM | POA: Insufficient documentation

## 2023-06-05 LAB — HELIX MOLECULAR SCREEN: Genetic Analysis Overall Interpretation: NEGATIVE

## 2023-06-30 DIAGNOSIS — L812 Freckles: Secondary | ICD-10-CM | POA: Diagnosis not present

## 2023-06-30 DIAGNOSIS — D225 Melanocytic nevi of trunk: Secondary | ICD-10-CM | POA: Diagnosis not present

## 2023-06-30 DIAGNOSIS — L821 Other seborrheic keratosis: Secondary | ICD-10-CM | POA: Diagnosis not present

## 2023-08-15 DIAGNOSIS — J0100 Acute maxillary sinusitis, unspecified: Secondary | ICD-10-CM | POA: Diagnosis not present

## 2024-08-11 ENCOUNTER — Other Ambulatory Visit: Payer: Self-pay

## 2024-08-11 ENCOUNTER — Ambulatory Visit (INDEPENDENT_AMBULATORY_CARE_PROVIDER_SITE_OTHER): Admitting: Family Medicine

## 2024-08-11 VITALS — BP 117/82 | Ht 71.0 in | Wt 165.0 lb

## 2024-08-11 DIAGNOSIS — M79672 Pain in left foot: Secondary | ICD-10-CM

## 2024-08-11 MED ORDER — NITROGLYCERIN 0.2 MG/HR TD PT24: MEDICATED_PATCH | TRANSDERMAL | 1 refills | Status: AC

## 2024-08-11 NOTE — Progress Notes (Signed)
 PCP: Charlott Dorn LABOR, MD  Discussed the use of AI scribe software for clinical note transcription with the patient, who gave verbal consent to proceed.  History of Present Illness Jerome Moses is a 58 year old male with bilateral Achilles tendinitis and left peroneal tendinopathy who presents for evaluation of persistent lower extremity pain.  Achilles tendinopathy symptoms and trajectory - Bilateral Achilles pain since summer 2025, initially right-sided then involving the left. - Pain is chronic and primarily occurs post-run, not during activity. - Symptoms worsened after the [facility] in September 2025, especially following downhill running. - Partial improvement with reduced running frequency, distance, and speed. - Currently runs three days per week, previously five days per week. - Avoids consecutive running days. - Aleve provides symptomatic relief. - Pain fluctuates but persists. - Concerned that continued running may worsen symptoms.  Rehabilitative measures and response - Intermittent use of slant board for stretching and heel lifts in running shoes, which provide some relief. - Has not performed consistent rehabilitation exercises.  Left peroneal tendinopathy symptoms - Persistent soreness on the left lateral foot with slight extension to the plantar aspect. - Soreness is annoying but not limiting. - Able to run over five miles, though at a slower pace than desired.  No past medical history on file.  Medications Ordered Prior to Encounter[1]  Past Surgical History:  Procedure Laterality Date   APPENDECTOMY      Allergies[2]  BP 117/82   Ht 5' 11 (1.803 m)   Wt 165 lb (74.8 kg)   BMI 23.01 kg/m      12/18/2021   10:39 AM 01/15/2022    9:03 AM  [facility]  Frequency of aerobic exercise (# of days/week) 6 6  Average time in minutes 45 45  Frequency of strengthening activities (# of days/week) 3 3        No data to  display              Objective:  Physical Exam:  Gen: NAD, comfortable in exam room  Bilateral feet/ankles: No gross deformity, swelling, ecchymoses Full range of motion ankles Tenderness to palpation insertion of achilles on calcaneus bilaterally.  No base 5th, peroneal tenderness on left negative ant drawer and negative talar tilt.   Negative calcaneal and metatarsal squeeze bilaterally NV intact distally.  Limited MSK u/s bilateral feet:  achilles tendons intact; both with notable thickening at insertion onto calcaneus and calcific changes within tendon.  Left 5th metatarsal without cortical irregularity, edema overlying cortex.  Peroneus brevis intact without abnormalities. 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 Symptoms improved with activity modification but remain problematic. Insertional tendinopathy is slower to resolve and more challenging to treat. Nitroglycerin patches preferred due to insurance coverage and lower cost. Gradual improvement anticipated with adherence to rehabilitation and adjunctive therapies. - Provided home rehabilitation program: slant board stretching (20-30 seconds per stretch) and eccentric drop and raise exercises on a step. - Recommended 5/16 inch heel lifts in running and regular shoes. - Advised running on flat surfaces to minimize exacerbation from hills. - Discussed nitroglycerin patches (1/4 patch daily, rotating sites; may use on both heels after one week if tolerated). - Reviewed risks of nitroglycerin patches: skin irritation/rash (3-5%) and headache (10%), with lower risk at reduced dose. - Discussed shockwave therapy as an experimental option; nitroglycerin preferred if adjunctive therapy is pursued due to cost and insurance coverage.  Left peroneal tendinopathy Chronic left lateral foot pain consistent with  insertional peroneal tendinopathy. Other etiologies such as plantar fasciitis, calcaneal stress fracture, and fat pad atrophy are less likely based on exam findings. - Ultrasound reassuring against 5th metatarsal stress fracture or peroneus brevis tendon tear      [1]  No current outpatient medications on file prior to visit.   No current facility-administered medications on file prior to visit.  [2] No Known Allergies

## 2024-08-11 NOTE — Patient Instructions (Signed)
 You have Achilles Tendinopathy Aleve or ibuprofen as needed with food for pain and inflammation. Nitroglycerin patches - 1/4th patch to affected heel, change daily.  After using on 1 side for a week you can use 1/4th patch on both heels. Do home exercises daily as directed. Icing 15 minutes at a time 3-4 times a day. Avoid uneven ground, hills as much as possible. Heel lifts in shoes or shoes with a natural heel lift. Avoid flat shoes, barefoot walking. Follow up in 6 weeks.

## 2024-09-22 ENCOUNTER — Ambulatory Visit: Admitting: Family Medicine
